# Patient Record
Sex: Female | Born: 1995
Health system: Southern US, Community
[De-identification: ages and names within clinical notes are randomized; demographics above are authoritative.]

---
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
## 2017-03-01 DIAGNOSIS — Z0000 Encounter for general adult medical examination without abnormal findings: Secondary | ICD-10-CM | POA: Diagnosis not present

## 2017-03-01 DIAGNOSIS — Z789 Other specified health status: Secondary | ICD-10-CM | POA: Diagnosis not present

## 2017-03-01 DIAGNOSIS — J4540 Moderate persistent asthma, uncomplicated: Secondary | ICD-10-CM | POA: Diagnosis not present

## 2017-03-08 DIAGNOSIS — Z111 Encounter for screening for respiratory tuberculosis: Secondary | ICD-10-CM | POA: Diagnosis not present

## 2017-12-13 DIAGNOSIS — R300 Dysuria: Secondary | ICD-10-CM | POA: Diagnosis not present

## 2018-03-07 DIAGNOSIS — N390 Urinary tract infection, site not specified: Secondary | ICD-10-CM | POA: Diagnosis not present

## 2018-03-17 DIAGNOSIS — J4540 Moderate persistent asthma, uncomplicated: Secondary | ICD-10-CM | POA: Diagnosis not present

## 2018-03-17 DIAGNOSIS — Z111 Encounter for screening for respiratory tuberculosis: Secondary | ICD-10-CM | POA: Diagnosis not present

## 2018-03-17 DIAGNOSIS — Z0000 Encounter for general adult medical examination without abnormal findings: Secondary | ICD-10-CM | POA: Diagnosis not present

## 2018-03-17 DIAGNOSIS — Z681 Body mass index (BMI) 19 or less, adult: Secondary | ICD-10-CM | POA: Diagnosis not present

## 2018-03-21 DIAGNOSIS — Z111 Encounter for screening for respiratory tuberculosis: Secondary | ICD-10-CM | POA: Diagnosis not present

## 2018-03-23 ENCOUNTER — Encounter (HOSPITAL_COMMUNITY): Payer: Self-pay | Admitting: *Deleted

## 2018-03-23 ENCOUNTER — Emergency Department (HOSPITAL_COMMUNITY)
Admission: EM | Admit: 2018-03-23 | Discharge: 2018-03-23 | Disposition: A | Discharge status: Home / Self Care | Payer: BLUE CROSS/BLUE SHIELD | Attending: Emergency Medicine | Admitting: Emergency Medicine

## 2018-03-23 ENCOUNTER — Other Ambulatory Visit: Payer: Self-pay

## 2018-03-23 DIAGNOSIS — Y939 Activity, unspecified: Secondary | ICD-10-CM | POA: Diagnosis not present

## 2018-03-23 DIAGNOSIS — W25XXXA Contact with sharp glass, initial encounter: Secondary | ICD-10-CM | POA: Diagnosis not present

## 2018-03-23 DIAGNOSIS — Y999 Unspecified external cause status: Secondary | ICD-10-CM | POA: Insufficient documentation

## 2018-03-23 DIAGNOSIS — S61216A Laceration without foreign body of right little finger without damage to nail, initial encounter: Secondary | ICD-10-CM | POA: Diagnosis not present

## 2018-03-23 DIAGNOSIS — Y929 Unspecified place or not applicable: Secondary | ICD-10-CM | POA: Diagnosis not present

## 2018-03-23 NOTE — ED Provider Notes (Signed)
[facility]-EMERGENCY DEPT Provider Note   CSN: [id number] Arrival date & time: 03/23/18  0117     History   Chief Complaint Chief Complaint  Patient presents with  . Laceration    HPI Vicki Butler is a 22 y.o. female.  Patient presents to the emergency department with a chief complaint of right small finger laceration.  She states that she cut her finger on a glass bowl.  She has had difficulty controlling the bleeding.  She denies numbness, weakness, or tingling.  Denies any other associated symptoms.  The history is provided by the patient. No language interpreter was used.    History reviewed. No pertinent past medical history.  There are no active problems to display for this patient.   History reviewed. No pertinent surgical history.   OB History   None      Home Medications    Prior to Admission medications   Not on File    Family History No family history on file.  Social History Social History   Tobacco Use  . Smoking status: Never Smoker  . Smokeless tobacco: Never Used  Substance Use Topics  . Alcohol use: Never    Frequency: Never  . Drug use: Never     Allergies   Patient has no known allergies.   Review of Systems Review of Systems  All other systems reviewed and are negative.    Physical Exam Updated Vital Signs BP 122/68 (BP Location: Left Arm)   Pulse 66   Temp 98.4 F (36.9 C) (Oral)   Resp 14   Ht 5\' 4"  (1.626 m)   Wt 44.5 kg   LMP 03/13/2018   SpO2 100%   BMI 16.82 kg/m   Physical Exam  Constitutional: She is oriented to person, place, and time. No distress.  HENT:  Head: Normocephalic and atraumatic.  Eyes: Pupils are equal, round, and reactive to light. Conjunctivae and EOM are normal.  Neck: No tracheal deviation present.  Cardiovascular: Normal rate.  Pulmonary/Chest: Effort normal. No respiratory distress.  Abdominal: Soft.  Musculoskeletal: Normal range of motion.    Neurological: She is alert and oriented to person, place, and time.  Skin: Skin is warm and dry. She is not diaphoretic.  1 cm curvilinear laceration to the ulnar aspect of the dorsal and distal right small finger without injury to the nail or nailbed  Psychiatric: Judgment normal.  Nursing note and vitals reviewed.    ED Treatments / Results  Labs (all labs ordered are listed, but only abnormal results are displayed) Labs Reviewed - No data to display  EKG None  Radiology No results found.  Procedures Procedures (including critical care time) LACERATION REPAIR Performed by: Roxy Horsemanobert Shavonne Ambroise Authorized by: Roxy Horsemanobert Keltie Labell Consent: Verbal consent obtained. Risks and benefits: risks, benefits and alternatives were discussed Consent given by: patient Patient identity confirmed: provided demographic data Prepped and Draped in normal sterile fashion Wound explored  Laceration Location: small finger  Laceration Length: 1cm  No Foreign Bodies seen or palpated  Anesthesia: none  Local anesthetic: n/a  Anesthetic total: n/a  Irrigation method: syringe Amount of cleaning: standard  Skin closure: dermabond  Number of sutures: dermabond  Technique: dermabond  Patient tolerance: Patient tolerated the procedure well with no immediate complications.  Medications Ordered in ED Medications - No data to display   Initial Impression / Assessment and Plan / ED Course  I have reviewed the triage vital signs and the nursing notes.  Pertinent labs &  imaging results that were available during my care of the patient were reviewed by me and considered in my medical decision making (see chart for details).     Laceration is minor.  No deep structural involvement.  Sensation and strength intact.  Closed with Dermabond.  Final Clinical Impressions(s) / ED Diagnoses   Final diagnoses:  Laceration of right little finger without foreign body without damage to nail, initial  encounter    ED Discharge Orders    None       Roxy HorsemanBrowning, Abdiel Blackerby, PA-C 03/23/18 0231    Nira Connardama, Pedro Eduardo, MD 03/23/18 [id number]

## 2018-03-23 NOTE — ED Notes (Signed)
Dermabond at bedside. Pt soaking finger

## 2018-03-23 NOTE — ED Triage Notes (Signed)
Pt reports she cut her right hand pinkie finger on a glass bowl. Bleeding controlled in triage.

## 2018-04-27 DIAGNOSIS — Z111 Encounter for screening for respiratory tuberculosis: Secondary | ICD-10-CM | POA: Diagnosis not present

## 2019-08-23 DIAGNOSIS — Z03818 Encounter for observation for suspected exposure to other biological agents ruled out: Secondary | ICD-10-CM | POA: Diagnosis not present

## 2019-09-01 DIAGNOSIS — J4520 Mild intermittent asthma, uncomplicated: Secondary | ICD-10-CM | POA: Diagnosis not present

## 2021-10-28 ENCOUNTER — Other Ambulatory Visit: Payer: Self-pay | Admitting: Internal Medicine

## 2021-10-28 DIAGNOSIS — E049 Nontoxic goiter, unspecified: Secondary | ICD-10-CM
